# Patient Record
Sex: Male | Born: 1985 | Hispanic: No | Marital: Married | Smoking: Former smoker
Health system: Southern US, Community
[De-identification: ages and names within clinical notes are randomized; demographics above are authoritative.]

---
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
## 2007-09-28 ENCOUNTER — Emergency Department (HOSPITAL_COMMUNITY): Admission: EM | Admit: 2007-09-28 | Discharge: 2007-09-28 | Payer: Self-pay | Admitting: Family Medicine

## 2008-06-19 ENCOUNTER — Emergency Department (HOSPITAL_COMMUNITY): Admission: EM | Admit: 2008-06-19 | Discharge: 2008-06-19 | Payer: Self-pay | Admitting: Family Medicine

## 2008-06-22 ENCOUNTER — Emergency Department (HOSPITAL_COMMUNITY): Admission: EM | Admit: 2008-06-22 | Discharge: 2008-06-22 | Payer: Self-pay | Admitting: Emergency Medicine

## 2011-08-13 ENCOUNTER — Emergency Department (HOSPITAL_COMMUNITY)
Admission: EM | Admit: 2011-08-13 | Discharge: 2011-08-14 | Disposition: A | Discharge status: Home / Self Care | Payer: Worker's Compensation | Attending: Emergency Medicine | Admitting: Emergency Medicine

## 2011-08-13 ENCOUNTER — Emergency Department (HOSPITAL_COMMUNITY): Payer: Worker's Compensation

## 2011-08-13 DIAGNOSIS — S8990XA Unspecified injury of unspecified lower leg, initial encounter: Secondary | ICD-10-CM | POA: Insufficient documentation

## 2011-08-13 DIAGNOSIS — Y990 Civilian activity done for income or pay: Secondary | ICD-10-CM | POA: Insufficient documentation

## 2011-08-13 DIAGNOSIS — S99929A Unspecified injury of unspecified foot, initial encounter: Secondary | ICD-10-CM | POA: Insufficient documentation

## 2011-08-13 DIAGNOSIS — Y9269 Other specified industrial and construction area as the place of occurrence of the external cause: Secondary | ICD-10-CM | POA: Insufficient documentation

## 2011-08-13 DIAGNOSIS — W240XXA Contact with lifting devices, not elsewhere classified, initial encounter: Secondary | ICD-10-CM | POA: Insufficient documentation

## 2011-08-13 DIAGNOSIS — M79609 Pain in unspecified limb: Secondary | ICD-10-CM | POA: Insufficient documentation

## 2012-01-25 DEATH — deceased

## 2013-01-25 ENCOUNTER — Encounter (HOSPITAL_COMMUNITY): Payer: Self-pay | Admitting: *Deleted

## 2013-01-25 ENCOUNTER — Emergency Department (HOSPITAL_COMMUNITY)
Admission: EM | Admit: 2013-01-25 | Discharge: 2013-01-25 | Disposition: A | Discharge status: Home / Self Care | Payer: Self-pay | Attending: Emergency Medicine | Admitting: Emergency Medicine

## 2013-01-25 DIAGNOSIS — F17200 Nicotine dependence, unspecified, uncomplicated: Secondary | ICD-10-CM | POA: Insufficient documentation

## 2013-01-25 DIAGNOSIS — S61209A Unspecified open wound of unspecified finger without damage to nail, initial encounter: Secondary | ICD-10-CM | POA: Insufficient documentation

## 2013-01-25 DIAGNOSIS — Y9229 Other specified public building as the place of occurrence of the external cause: Secondary | ICD-10-CM | POA: Insufficient documentation

## 2013-01-25 DIAGNOSIS — W260XXA Contact with knife, initial encounter: Secondary | ICD-10-CM | POA: Insufficient documentation

## 2013-01-25 DIAGNOSIS — Z23 Encounter for immunization: Secondary | ICD-10-CM | POA: Insufficient documentation

## 2013-01-25 DIAGNOSIS — Y93G1 Activity, food preparation and clean up: Secondary | ICD-10-CM | POA: Insufficient documentation

## 2013-01-25 MED ORDER — TETANUS-DIPHTH-ACELL PERTUSSIS 5-2.5-18.5 LF-MCG/0.5 IM SUSP
0.5000 mL | Freq: Once | INTRAMUSCULAR | Status: AC
  Administered 2013-01-25: 0.5 mL via INTRAMUSCULAR
  Filled 2013-01-25: qty 0.5

## 2013-01-25 NOTE — ED Provider Notes (Signed)
History  This chart was scribed for non-physician practitioner working with Ethelda Chick, MD by Erskine Emery, ED Scribe. This patient was seen in room TR08C/TR08C and the patient's care was started at 18:26.   CSN: [id number]  Arrival date & time 01/25/13  Rickey Primus   First MD Initiated Contact with Patient 01/25/13 1826      Chief Complaint  Patient presents with  . Finger Injury    (Consider location/radiation/quality/duration/timing/severity/associated sxs/prior treatment) The history is provided by the patient. No language interpreter was used.  Jeremy Little is a 27 y.o. male who presents to the Emergency Department complaining of a laceration to his right middle finger since 5:30pm this evening when he cut it on a clean, sharp knife at work, as a Midwife. Pt applied a pressure dressing and the bleeding now appears to be controlled. Pt had FROM and denies any associated numbness or tingling. He reports his pain is about a 4/10 but his anxiety is about a 12/10. He has a phobia of hospitals and needles. Pt does not know if his Tetanus is UTD. Pt drove here and does not have a ride home.  Pt has no PCP.  History reviewed. No pertinent past medical history.  History reviewed. No pertinent past surgical history.  History reviewed. No pertinent family history.  History  Substance Use Topics  . Smoking status: Current Every Day Smoker    Types: Cigarettes  . Smokeless tobacco: Not on file  . Alcohol Use: No      Review of Systems  Constitutional: Negative for fever and diaphoresis.  HENT: Negative for neck pain and neck stiffness.   Eyes: Negative for visual disturbance.  Respiratory: Negative for apnea, chest tightness and shortness of breath.   Cardiovascular: Negative for chest pain and palpitations.  Gastrointestinal: Negative for nausea, vomiting, diarrhea and constipation.  Genitourinary: Negative for dysuria.  Musculoskeletal: Negative for gait problem.  Skin: Positive  for wound (right middle finger laceration). Negative for rash.       Middle finger of right hand  Neurological: Negative for dizziness, weakness, light-headedness, numbness and headaches.  Psychiatric/Behavioral: The patient is nervous/anxious.   All other systems reviewed and are negative.    Allergies  Review of patient's allergies indicates no known allergies.  Home Medications  No current outpatient prescriptions on file.  Triage Vitals: BP 117/61  Pulse 89  Temp(Src) 98.4 F (36.9 C) (Oral)  Resp 18  SpO2 99%  Physical Exam  Nursing note and vitals reviewed. Constitutional: He is oriented to person, place, and time. He appears well-developed and well-nourished. No distress.  HENT:  Head: Normocephalic and atraumatic.  Eyes: Conjunctivae and EOM are normal.  Neck: Normal range of motion. Neck supple.  No meningeal signs  Cardiovascular: Normal rate, regular rhythm, normal heart sounds and intact distal pulses.  Exam reveals no gallop and no friction rub.   No murmur heard. Pulmonary/Chest: Effort normal and breath sounds normal. No respiratory distress. He has no wheezes. He has no rales. He exhibits no tenderness.  Abdominal: Soft. Bowel sounds are normal. He exhibits no distension. There is no tenderness.  Musculoskeletal: Normal range of motion. He exhibits tenderness. He exhibits no edema.  Full range of motion, no joint involvement  Neurological: He is alert and oriented to person, place, and time. No cranial nerve deficit.  No focal deficits, sensation to light touch intact, two-point discrimination intact  Skin: Skin is warm and dry. He is not diaphoretic. No erythema.  4 cm  laceration to right middle finger.    ED Course  Procedures (including critical care time) DIAGNOSTIC STUDIES: Oxygen Saturation is 99% on room air, normal by my interpretation.    COORDINATION OF CARE: 20:45--I checked on the pt.  21:07--I evaluated the patient and we discussed a  treatment plan including suture repair to which the pt agreed. Pt refused pain medications as he is driving home and needs to work.  21:10--I performed laceration repair. LACERATION REPAIR PROCEDURE NOTE The patient's identification was confirmed and consent was obtained. This procedure was performed by Glade Nurse, PA at 9:10 PM. Site: right middle finger Sterile procedures observed: yes Anesthetic used (type and amt): 2% lidocaine without epinephrine Suture type/size: 4-0 prolene Length: 4 cm # of Sutures: 4 Technique: interrupted Complexity: simple Tetanus UTD or ordered: ordered Site anesthetized, irrigated with NS, explored without evidence of foreign body, wound well approximated, site covered with dry, sterile dressing.  Patient tolerated procedure well without complications. Instructions for care discussed verbally and patient provided with additional written instructions for homecare and f/u.  I notified the pt that he needs to have his sutures removed in about 7 days.   Labs Reviewed - No data to display No results found.   Diagnosis: laceration to middle finger on right hand    MDM  Tdap booster given. Wound cleaning complete with pressure irrigation, bottom of wound visualized, no foreign bodies appreciated. Laceration occurred < 8 hours prior to repair which was well tolerated. Pt has no co morbidities to effect normal wound healing. Discussed suture home care w pt and answered questions. Pt to f-u for wound check and suture removal in 7 days. Pt is hemodynamically stable w no complaints prior to dc.    I personally performed the services described in this documentation, which was scribed in my presence. The recorded information has been reviewed and is accurate.   Glade Nurse, PA-C 01/27/13 1714

## 2013-01-25 NOTE — ED Notes (Signed)
Reports cutting right middle finger on sharp knife at work, pt has pressure dressing applied pta and appears bleeding is controlled. Unknown tetanus.

## 2013-01-25 NOTE — ED Notes (Signed)
Alcario Drought, RN completed wound care, sterile guaze applied to wound, per pt tolerated procedure well

## 2013-01-25 NOTE — ED Notes (Signed)
Pt to return with photo ID for completion of worker's compensation paperwork sent my place of employment

## 2013-02-03 NOTE — ED Provider Notes (Signed)
Medical screening examination/treatment/procedure(s) were performed by non-physician practitioner and as supervising physician I was immediately available for consultation/collaboration.  Martha K Linker, MD 02/03/13 1706 

## 2017-03-05 ENCOUNTER — Other Ambulatory Visit: Payer: Self-pay | Admitting: Occupational Medicine

## 2017-03-05 ENCOUNTER — Ambulatory Visit: Payer: Self-pay

## 2017-03-05 DIAGNOSIS — R0781 Pleurodynia: Secondary | ICD-10-CM

## 2020-01-28 ENCOUNTER — Emergency Department (HOSPITAL_COMMUNITY): Payer: BC Managed Care – PPO

## 2020-01-28 ENCOUNTER — Encounter (HOSPITAL_COMMUNITY): Payer: Self-pay

## 2020-01-28 ENCOUNTER — Other Ambulatory Visit: Payer: Self-pay

## 2020-01-28 ENCOUNTER — Emergency Department (HOSPITAL_COMMUNITY)
Admission: EM | Admit: 2020-01-28 | Discharge: 2020-01-28 | Disposition: A | Discharge status: Home / Self Care | Payer: BC Managed Care – PPO | Attending: Emergency Medicine | Admitting: Emergency Medicine

## 2020-01-28 DIAGNOSIS — R42 Dizziness and giddiness: Secondary | ICD-10-CM | POA: Diagnosis not present

## 2020-01-28 DIAGNOSIS — F17210 Nicotine dependence, cigarettes, uncomplicated: Secondary | ICD-10-CM | POA: Diagnosis not present

## 2020-01-28 DIAGNOSIS — R519 Headache, unspecified: Secondary | ICD-10-CM

## 2020-01-28 DIAGNOSIS — H9193 Unspecified hearing loss, bilateral: Secondary | ICD-10-CM | POA: Insufficient documentation

## 2020-01-28 DIAGNOSIS — G43809 Other migraine, not intractable, without status migrainosus: Secondary | ICD-10-CM | POA: Insufficient documentation

## 2020-01-28 MED ORDER — METOCLOPRAMIDE HCL 5 MG/ML IJ SOLN: 10.0000 mg | Freq: Once | INTRAMUSCULAR | Status: DC

## 2020-01-28 MED ORDER — DIPHENHYDRAMINE HCL 25 MG PO CAPS
50.0000 mg | ORAL_CAPSULE | Freq: Once | ORAL | Status: AC
  Administered 2020-01-28: 50 mg via ORAL
  Filled 2020-01-28: qty 2

## 2020-01-28 MED ORDER — SODIUM CHLORIDE 0.9 % IV BOLUS: 1000.0000 mL | Freq: Once | INTRAVENOUS | Status: DC

## 2020-01-28 MED ORDER — PROCHLORPERAZINE MALEATE 5 MG PO TABS
10.0000 mg | ORAL_TABLET | Freq: Once | ORAL | Status: AC
  Administered 2020-01-28: 10 mg via ORAL
  Filled 2020-01-28: qty 2

## 2020-01-28 MED ORDER — DIPHENHYDRAMINE HCL 50 MG/ML IJ SOLN: 25.0000 mg | Freq: Once | INTRAMUSCULAR | Status: DC

## 2020-01-28 NOTE — ED Notes (Signed)
Spoke with mother while on speaker in pt. room

## 2020-01-28 NOTE — ED Triage Notes (Signed)
Pt reports while at work he suddenly had a severe headache in the back of his head and lost most of his hearing. Pt states he works in a loud environment but this has never happened before. Pt took 4 ibuprofen PTA and pain is now 3/10. Denies visual changes or dizziness.

## 2020-01-28 NOTE — ED Provider Notes (Signed)
[facility] EMERGENCY DEPARTMENT Provider Note   CSN: [id number] Arrival date & time: 01/28/20  1006     History Chief Complaint  Patient presents with  . Headache  . Hearing Loss    Jeremy Little is a 34 y.o. male who presents to ED with a chief complaint of headache.  Approximately 4 hours ago was at work standing up when he had a severe headache to the back of his head and bilateral temporal area.  States that he lost about 90% of his hearing when the headache began.  He took 4 ibuprofen prior to arrival and has had gradual improvement in his headache and hearing loss.  Denies history of similar symptoms in the past.  Does have a history of migraines about 10 years ago but this does not feel similar and he did not have any hearing loss associated with his migraines.  Ports associated dizziness she denies any loss of consciousness, neck pain or stiffness, fever, vomiting, numbness in arms or legs, injuries or falls, anticoagulant use, personal history of aneurysms.  HPI     History reviewed. No pertinent past medical history.  There are no problems to display for this patient.   History reviewed. No pertinent surgical history.     No family history on file.  Social History   Tobacco Use  . Smoking status: Current Every Day Smoker    Types: Cigarettes  Substance Use Topics  . Alcohol use: No  . Drug use: No    Home Medications Prior to Admission medications   Medication Sig Start Date End Date Taking? Authorizing Provider  acetaminophen (TYLENOL) 325 MG tablet Take 650 mg by mouth every 6 (six) hours as needed for mild pain.   Yes [provider]  ibuprofen (ADVIL) 200 MG tablet Take 600-800 mg by mouth every 8 (eight) hours as needed for moderate pain.   Yes [provider]    Allergies    Patient has no known allergies.  Review of Systems   Review of Systems  Constitutional: Negative for appetite change, chills and fever.    HENT: Positive for hearing loss. Negative for ear pain, rhinorrhea, sneezing and sore throat.   Eyes: Negative for photophobia and visual disturbance.  Respiratory: Negative for cough, chest tightness, shortness of breath and wheezing.   Cardiovascular: Negative for chest pain and palpitations.  Gastrointestinal: Negative for abdominal pain, blood in stool, constipation, diarrhea, nausea and vomiting.  Genitourinary: Negative for dysuria, hematuria and urgency.  Musculoskeletal: Negative for myalgias.  Skin: Negative for rash.  Neurological: Positive for dizziness and headaches. Negative for weakness and light-headedness.    Physical Exam Updated Vital Signs BP 115/74   Pulse 66   Temp 97.7 F (36.5 C) (Oral)   Resp 18   Ht 5\' 6"  (1.676 m)   Wt 57.2 kg   SpO2 99%   BMI 20.34 kg/m   Physical Exam Vitals and nursing note reviewed.  Constitutional:      General: He is not in acute distress.    Appearance: He is well-developed.  HENT:     Head: Normocephalic and atraumatic.     Right Ear: Tympanic membrane normal. No mastoid tenderness. No hemotympanum.     Left Ear: Tympanic membrane normal. No mastoid tenderness. No hemotympanum.     Nose: Nose normal.  Eyes:     General: No scleral icterus.       Right eye: No discharge.  Left eye: No discharge.     Conjunctiva/sclera: Conjunctivae normal.     Pupils: Pupils are equal, round, and reactive to light.  Neck:     Comments: No meningismus. Cardiovascular:     Rate and Rhythm: Normal rate and regular rhythm.     Heart sounds: Normal heart sounds. No murmur. No friction rub. No gallop.   Pulmonary:     Effort: Pulmonary effort is normal. No respiratory distress.     Breath sounds: Normal breath sounds.  Abdominal:     General: Bowel sounds are normal. There is no distension.     Palpations: Abdomen is soft.     Tenderness: There is no abdominal tenderness. There is no guarding.  Musculoskeletal:        General:  Normal range of motion.     Cervical back: Normal range of motion and neck supple.  Skin:    General: Skin is warm and dry.     Findings: No rash.  Neurological:     General: No focal deficit present.     Mental Status: He is alert and oriented to person, place, and time.     Cranial Nerves: No cranial nerve deficit.     Sensory: No sensory deficit.     Motor: No weakness or abnormal muscle tone.     Coordination: Coordination normal.     Comments: Pupils reactive. No facial asymmetry noted. Cranial nerves appear grossly intact. Sensation intact to light touch on face, BUE and BLE. Strength 5/5 in BUE and BLE.     ED Results / Procedures / Treatments   Labs (all labs ordered are listed, but only abnormal results are displayed) Labs Reviewed  RAPID URINE DRUG SCREEN, HOSP PERFORMED    EKG None  Radiology CT HEAD WO CONTRAST  Result Date: 01/28/2020 CLINICAL DATA:  Headache EXAM: CT HEAD WITHOUT CONTRAST TECHNIQUE: Contiguous axial images were obtained from the base of the skull through the vertex without intravenous contrast. COMPARISON:  None. FINDINGS: Brain: There is no acute intracranial hemorrhage, mass-effect, or edema. Gray-white differentiation is preserved. There is no extra-axial fluid collection. Ventricles and sulci are within normal limits in size and configuration. Vascular: No hyperdense vessel or unexpected calcification. Skull: Calvarium is unremarkable. Sinuses/Orbits: Minimal layering secretions in the left sphenoid sinus. Orbits are unremarkable. Other: None. IMPRESSION: Normal CT of the head. Electronically Signed   By: Guadlupe Spanish M.D.   On: 01/28/2020 11:17    Procedures Procedures (including critical care time)  Medications Ordered in ED Medications  prochlorperazine (COMPAZINE) tablet 10 mg (10 mg Oral Given 01/28/20 1354)  diphenhydrAMINE (BENADRYL) capsule 50 mg (50 mg Oral Given 01/28/20 1354)    ED Course  I have reviewed the triage vital signs and  the nursing notes.  Pertinent labs & imaging results that were available during my care of the patient were reviewed by me and considered in my medical decision making (see chart for details).  Clinical Course as of Jan 28 1527  Thu Jan 28, 2020  1311 Spoke to Dr. Laurence Slate, neurology who recommends CTA head and neck +/- MRI depending on findings.   [HK]    Clinical Course User Index [HK] Dietrich Pates, PA-C   MDM Rules/Calculators/A&P                      34 year old male presents to ED for headache and hearing loss that occurred prior to arrival.  Symptoms began at approximately 830 this morning.  Significantly improved with ibuprofen prior to arrival.  Still reports some changes to his hearing.  Patient with no deficits neurological exam during my evaluation.  No meningeal signs noted.  Vital signs are within normal limits.  Noncontrast CT of the head is negative for acute abnormality.  No evidence of subarachnoid hemorrhage.  Patient was evaluated by Dr. Wilford Corner of neurology who recommends lab work and MRIs.  Patient does not want CTA, any IV or blood work drawn.  Will obtain MRIs of the brain as well as MR angios of the head and neck.  If negative, patient will be discharged home as his symptoms are most likely due to a complex migraine. His symptoms continue to improve here with Tylenol. Care handed off to oncoming provider pending disposition reassessment after imaging.  Final Clinical Impression(s) / ED Diagnoses Final diagnoses:  Bilateral hearing loss, unspecified hearing loss type  Other migraine without status migrainosus, not intractable    Rx / DC Orders ED Discharge Orders    None      Portions of this note were generated with Dragon dictation software. Dictation errors may occur despite best attempts at proofreading.    Dietrich Pates, PA-C 01/28/20 1528    Sabas Sous, MD 02/02/20 [phone]

## 2020-01-28 NOTE — ED Notes (Signed)
Pt reports hearing improving to 60%. Is able to hear staff. Headache has improved with Tylenol.

## 2020-01-28 NOTE — ED Notes (Signed)
Pt taken to MRI  

## 2020-01-28 NOTE — Discharge Instructions (Signed)
Get help right away if: °Your migraine headache becomes severe. °Your migraine headache lasts longer than 72 hours. °You have a fever. °You have a stiff neck. °You have vision loss. °Your muscles feel weak or like you cannot control them. °You start to lose your balance often. °You have trouble walking. °You faint. °You have a seizure. °

## 2020-01-28 NOTE — ED Notes (Addendum)
Pt refused IV as well as bloodwork, states, " hates needles and is feeling very anxious". Dr. Truitt Merle.

## 2020-01-28 NOTE — ED Notes (Signed)
Pt was discharge instructions and questions were clarified. Pt acknowledge teaching and instructions.

## 2020-01-28 NOTE — Consult Note (Addendum)
Neurology Consultation  Reason for Consult: Headache, hearing loss of sudden onset Referring Physician: Dr Pilar Plate, EDP  CC: Sudden onset of hearing loss after sudden onset of headache History is obtained from: Patient  HPI: Jeremy Little is a 34 y.o. male with no significant past medical history presenting to the emergency room for evaluation of sudden onset of headache in the back of the head and ensuing hearing loss which has since improved now to near normal. 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  He could not hear anything that his colleagues were saying.  Due to the sudden onset of these neurological symptoms, he was brought into the emergency room for evaluation.  His symptoms of since improved. He does not know his family history-no history of aneurysms could be elicited. Does not report a thunderclap type of headache but says it was a very severe headache which came on without any warning.  He has had migraines as a young adult but has not had them for many years. Denies any history of strokes in the family again secondary to not knowing his family history.. Denies fevers or chills.  Denies neck pain.  Denies neck stiffness.  Denies any sick exposures.  Denies chest pain.  Denies nausea vomiting.  Denies abdominal pain.  Denies any visual changes but said he felt dizzy at that time as an lightheaded.  LKW: 8 AM today tpa given?: no, symptoms likely concerning for complex migraine and not a stroke.  NIH 0.  Outside the window by the time seen by neurology. Premorbid modified Rankin scale (mRS): 0  ROS: ROS was performed and is negative except as noted in the HPI.  Past medical history: None   No family history on file. Patient does not know his family.  Social  History:   reports that he has been smoking cigarettes. He does not have any smokeless tobacco history on file. He reports that he does not drink alcohol or use drugs.  Confirmed above.  Medications No current facility-administered medications for this encounter. No current outpatient medications on file. No home meds  Exam: Current vital signs: BP 115/74   Pulse 66   Temp 97.7 F (36.5 C) (Oral)   Resp 18   Ht 5\' 6"  (1.676 m)   Wt 57.2 kg   SpO2 99%   BMI 20.34 kg/m  Vital signs in last 24 hours: Temp:  [97.7 F (36.5 C)] 97.7 F (36.5 C) (03/04 1009) Pulse Rate:  [66-130] 66 (03/04 1430) Resp:  [18] 18 (03/04 1009) BP: (98-128)/(74-89) 115/74 (03/04 1430) SpO2:  [98 %-100 %] 99 % (03/04 1430) Weight:  [57.2 kg] 57.2 kg (03/04 1329) GENERAL: Awake, alert in NAD HEENT: - Normocephalic and atraumatic, dry mm, no LN++, no Thyromegally.  No neck stiffness LUNGS - Clear to auscultation bilaterally with no wheezes CV - S1S2 RRR, no m/r/g, equal pulses bilaterally. ABDOMEN - Soft, nontender, nondistended with normoactive BS Ext: warm, well perfused, intact peripheral pulses, no edema Kernig's negative.  Brudzinski is negative.  NEURO:  Mental Status: AA&Ox3  Language: speech is nondysarthric.  Naming, repetition, fluency, and comprehension intact. Cranial Nerves: PERRL. EOMI, visual fields full, no facial asymmetry, facial sensation intact, hearing mildly reduced bilaterally-worse loss on the right compared  to left.  Upon using the tuning fork, he could hear some buzzing of the 128 Hz fork faintly in the left ear but barely in the right ear.  Upon putting the tuning fork on the mastoids, again same result as above.  Upon putting the tuning fork on the forehead, he reported hearing less and feeling less buzzing on the right compared to the left. Tongue/uvula/soft palate midline, normal sternocleidomastoid and trapezius muscle strength. No evidence of tongue atrophy or  fibrillations Motor: 5/5 with no drift in all 4 extremities Tone: is normal and bulk is normal Sensation- Intact to light touch bilaterally Coordination: FTN intact bilaterally Gait- deferred  NIHSS= 0   Labs I have reviewed labs in epic and the results pertinent to this consultation are:   CBC No results found for: WBC, RBC, HGB, HCT, PLT, MCV, MCH, MCHC, RDW, LYMPHSABS, MONOABS, EOSABS, BASOSABS  CMP  No results found for: NA, K, CL, CO2, GLUCOSE, BUN, CREATININE, CALCIUM, PROT, ALBUMIN, AST, ALT, ALKPHOS, BILITOT, GFRNONAA, GFRAA  Lipid Panel  No results found for: CHOL, TRIG, HDL, CHOLHDL, VLDL, LDLCALC, LDLDIRECT   Imaging I have reviewed the images obtained: CT-scan of the brain-no acute changes  Assessment:  34 year old with sudden onset of headache and bilateral hearing loss which has since improved-both the headache and the hearing loss. Reports worse hearing loss on the right than the left. Examination as documented above-with some inconsistencies and possible psychological overlay. Reports excessive work stressors in the recent few weeks. Had migraines many years ago but has not had a migraine headache for a while. Differentials include complex migraine versus nonorganic etiology but given the sudden onset of a headache-although not the worst headache of his life, with auditory symptoms, differential should include subarachnoid hemorrhage and meningitis. Clinical exam is not consistent with either. MR brain and head and neck vessel imaging should be performed to rule out aneurysm.  Impression: Headache and hearing loss/likely complex migraine versus nonorganic etiology  Recommendations: -MRI brain -MRA head without contrast and MRA neck without contrast (would have preferred a CTA but patient is very afraid of needles and would not want IV access.) -I would recommend getting a urinary toxicology screen as well as basic set of labs including CBC and BMP. -Can  try migraine cocktail after MRI MRA  Exam is reassuring-does not appear to be consistent with subarachnoid bleed or meningitis.  Other causes of bilateral deafness could include an AICA stroke, but that is mostly unilateral-and his symptoms have nearly resolved.  If imaging is unremarkable and labs are unremarkable, no further neurological recommendations.  Plan relayed to Dr. Sedonia Small in the ED. -- Amie Portland, MD [facility] Pager: [phone] If 7pm to 7am, please call on call as listed on AMION.  Addendum MRI brain MRA head and neck reviewed-no acute changes.  Mucous retention cyst in the sphenoid sinus on the left and maxillary sinus on the right.  Formal read from neuroradiology pending. Do not anticipate that the formal read would have anything acute that can explain his symptoms. Impression status as above. He can be discharged if the headache is better  -- Amie Portland, MD [facility] Pager: [phone] If 7pm to 7am, please call on call as listed on AMION.

## 2020-01-29 ENCOUNTER — Ambulatory Visit (INDEPENDENT_AMBULATORY_CARE_PROVIDER_SITE_OTHER): Payer: BC Managed Care – PPO | Admitting: Otolaryngology

## 2020-01-29 ENCOUNTER — Encounter (INDEPENDENT_AMBULATORY_CARE_PROVIDER_SITE_OTHER): Payer: Self-pay | Admitting: Otolaryngology

## 2020-01-29 VITALS — Temp 97.5°F

## 2020-01-29 DIAGNOSIS — G4459 Other complicated headache syndrome: Secondary | ICD-10-CM | POA: Diagnosis not present

## 2020-01-29 NOTE — Progress Notes (Signed)
HPI: Jeremy Little is a 34 y.o. male who presents is referred by [facility] urgent care for evaluation of recent complaints of hearing loss.  Apparently patient developed bad headaches yesterday and noticed hearing loss in both ears.  He states that it took 4 - 5 hours for the hearing to get better.  He apparently had an urgent CT scan and MRI scan that was read as normal with no cochlear or retrocochlear pathology noted.  He was apparently diagnosed with a migraine headache and referred here for hearing evaluation.  Is also referred to neurology and is in the process of making an appointment with them. He feels like his hearing is much better today but still not 100%.  No past medical history on file. No past surgical history on file. Social History   Socioeconomic History  . Marital status: Single    Spouse name: Not on file  . Number of children: Not on file  . Years of education: Not on file  . Highest education level: Not on file  Occupational History  . Not on file  Tobacco Use  . Smoking status: Current Every Day Smoker    Packs/day: 1.00    Years: 16.00    Pack years: 16.00    Types: Cigarettes    Start date: 2004  . Smokeless tobacco: Never Used  Substance and Sexual Activity  . Alcohol use: No  . Drug use: No  . Sexual activity: Not on file  Other Topics Concern  . Not on file  Social History Narrative  . Not on file   Social Determinants of Health   Financial Resource Strain:   . Difficulty of Paying Living Expenses: Not on file  Food Insecurity:   . Worried About Charity fundraiser in the Last Year: Not on file  . Ran Out of Food in the Last Year: Not on file  Transportation Needs:   . Lack of Transportation (Medical): Not on file  . Lack of Transportation (Non-Medical): Not on file  Physical Activity:   . Days of Exercise per Week: Not on file  . Minutes of Exercise per Session: Not on file  Stress:   . Feeling of Stress : Not on file  Social  Connections:   . Frequency of Communication with Friends and Family: Not on file  . Frequency of Social Gatherings with Friends and Family: Not on file  . Attends Religious Services: Not on file  . Active Member of Clubs or Organizations: Not on file  . Attends Archivist Meetings: Not on file  . Marital Status: Not on file   No family history on file. No Known Allergies Prior to Admission medications   Medication Sig Start Date End Date Taking? Authorizing Provider  acetaminophen (TYLENOL) 325 MG tablet Take 650 mg by mouth every 6 (six) hours as needed for mild pain.   Yes [provider]  ibuprofen (ADVIL) 200 MG tablet Take 600-800 mg by mouth every 8 (eight) hours as needed for moderate pain.   Yes [provider]     Positive ROS: Otherwise negative  All other systems have been reviewed and were otherwise negative with the exception of those mentioned in the HPI and as above.  Physical Exam: Constitutional: Alert, well-appearing, no acute distress.  He is able to hear conversation without difficulty. Ears: External ears without lesions or tenderness. Ear canals are clear bilaterally with intact, clear TMs bilaterally. Nasal: External nose without lesions. Septum midline.Marland Kitchen  Clear nasal passages Oral: Lips and gums without lesions. Tongue and palate mucosa without lesions. Posterior oropharynx clear. Neck: No palpable adenopathy or masses Respiratory: Breathing comfortably  Skin: No facial/neck lesions or rash noted.  Audiogram was performed in the office today and demonstrated normal hearing in both ears with SRT's of 15 dB bilaterally. Review of the CT scan shows no sinus abnormalities, mastoid or middle ear abnormalities.  Procedures  Assessment: Migraine headache Normal hearing evaluation on audiologic testing today  Plan: Did not recommend any specific therapy concerning his hearing. Recommended follow-up with neurology concerning further  treatment of his headaches. Reassured him of normal ear evaluation today.   Narda Bonds, MD   CC:

## 2020-02-01 ENCOUNTER — Encounter (INDEPENDENT_AMBULATORY_CARE_PROVIDER_SITE_OTHER): Payer: Self-pay

## 2020-02-02 ENCOUNTER — Ambulatory Visit: Payer: BC Managed Care – PPO | Admitting: Neurology

## 2020-02-02 ENCOUNTER — Encounter: Payer: Self-pay | Admitting: Neurology

## 2020-02-02 ENCOUNTER — Other Ambulatory Visit: Payer: Self-pay

## 2020-02-02 VITALS — BP 113/67 | HR 83 | Temp 98.0°F | Ht 67.0 in | Wt 125.0 lb

## 2020-02-02 DIAGNOSIS — Z789 Other specified health status: Secondary | ICD-10-CM

## 2020-02-02 DIAGNOSIS — G43009 Migraine without aura, not intractable, without status migrainosus: Secondary | ICD-10-CM

## 2020-02-02 NOTE — Patient Instructions (Addendum)
Your neurological exam is normal. You also had a benign brain MRI last week. I would recommend for your migraine managementTylenol as needed or ibuprofen as needed, none of these are recommended daily. Also, use Excedrin Migraine only if needed. Please try to reduce your caffeine intake and limit yourself to 1-2 servings per day, 8 ounce each. Please work on smoking cessation. Try to stay well-hydrated, 6 to 8 cups of water are recommended or 3-4 water bottles per day, 16.9 ounce each. Please try to stay well rested, try to achieve 7 to 8 h of sleep. I think, for now, we will have you work on lifestyle modification for your migraine management as you do not have a history of recurrent or frequent migraines thankfully. Please follow-up routinely to see one of our nurse practitioners in 3 months.   Try not to skip any meals as blood sugar fluctuations can also trigger headaches. I recommend that you get a formal eye examination done as it has been about 2 years since her last eye exam.  Please remember, common headache triggers are: sleep deprivation, dehydration, overheating, stress, hypoglycemia or skipping meals and blood sugar fluctuations, excessive pain medications or excessive alcohol use or caffeine withdrawal. Some people have food triggers such as aged cheese, orange juice or chocolate, especially dark chocolate, or MSG (monosodium glutamate). Try to avoid these headache triggers as much possible. It may be helpful to keep a headache diary to figure out what makes your headaches worse or brings them on and what alleviates them. Some people report headache onset after exercise but studies have shown that regular exercise may actually prevent headaches from coming. If you have exercise-induced headaches, please make sure that you drink plenty of fluid before and after exercising and that you do not over do it and do not overheat.

## 2020-02-02 NOTE — Progress Notes (Signed)
Subjective:    Patient ID: Jeremy Little is a 34 y.o. male.  HPI     Jeremy Foley, MD, PhD Jeremy Little [address] [address] [place], [place] [address]  I saw patient, Jeremy Little, as a referral from the ER for headache.  The patient is accompanied by his mother today.  Jeremy Little is a 76 year old right-handed gentleman with an underlying medical history of migraine headaches as a teenager and young adult, who reports a history of migraines when he was in high school. Last week he had a severe headache for the first time in a long time. He does endorse stress, his mother is concerned that he does not eat on a regular basis and may not hydrate well enough. He admits that he has done a little better with his water intake, averages about 2-3 bottles of water per day. He does drink quite a bit of caffeine and has also taken Excedrin for the past 2 days, 2 pills twice daily. He did not take any Excedrin today. He believes that the caffeine helps his headaches. He drinks 1 to 2 cups of coffee in the morning and 2-3 large cups of soda, 16 ounce size every day. He has had prescription eyeglasses since age 65. He has not been wearing his eyeglasses according to the mother. He has not had an eye examination in about 2 years he believes. He overall feels better since he presented to the emergency room. His hearing is better, headache is also improved, he denies any sudden onset of one-sided weakness or numbness or tingling or droopy face or slurring of speech. He tries to achieve about 7 to 8 h of sleep, denies any snoring or choking sensations at night. He lives with his parents, he does have a TV on in his bedroom at night. He works in [employer]. He denies any significant nausea or vomiting, he had felt dizzy when the headache was severe last week but denies any lightheadedness currently. He smokes 1 pack/day and is not currently considering smoking cessation. He drinks  alcohol in moderation, 2-3 beers about 3 days out of the week.  He presented to the emergency room on 01/28/2020 with sudden onset of severe headache in the back of his head and bilateral temple areas associated with sudden onset of hearing loss.  I reviewed the emergency room records, he also had a neurological consult in the ER which I reviewed.  He had several imaging tests in the ER.  He had a head CT without contrast on 01/28/2020 and I reviewed the results: IMPRESSION: Normal CT of the head.   He had a brain MRI without contrast as well as MRA head without contrast and MRA neck without contrast on 01/28/2020 and I reviewed the results: IMPRESSION: 1. Unremarkable appearance of the brain. 2. Negative head MRA. 3. Negative neck MRA.  He saw ENT as an outpatient, Dr. Ezzard Standing on 01/29/2020 and I reviewed the note: He was found to have no hearing loss on audiology testing and exam was benign.    His Past Medical History Is Significant For: History reviewed. No pertinent past medical history.  His Past Surgical History Is Significant For: History reviewed. No pertinent surgical history.  His Family History Is Significant For: History reviewed. No pertinent family history.  His Social History Is Significant For: Social History   Socioeconomic History  . Marital status: Single    Spouse name: Not on file  . Number of  children: Not on file  . Years of education: Not on file  . Highest education level: Not on file  Occupational History  . Not on file  Tobacco Use  . Smoking status: Current Every Day Smoker    Packs/day: 1.00    Years: 16.00    Pack years: 16.00    Types: Cigarettes    Start date: 2004  . Smokeless tobacco: Never Used  Substance and Sexual Activity  . Alcohol use: No  . Drug use: No  . Sexual activity: Not on file  Other Topics Concern  . Not on file  Social History Narrative  . Not on file   Social Determinants of Health   Financial Resource Strain:   .  Difficulty of Paying Living Expenses: Not on file  Food Insecurity:   . Worried About Charity fundraiser in the Last Year: Not on file  . Ran Out of Food in the Last Year: Not on file  Transportation Needs:   . Lack of Transportation (Medical): Not on file  . Lack of Transportation (Non-Medical): Not on file  Physical Activity:   . Days of Exercise per Week: Not on file  . Minutes of Exercise per Session: Not on file  Stress:   . Feeling of Stress : Not on file  Social Connections:   . Frequency of Communication with Friends and Family: Not on file  . Frequency of Social Gatherings with Friends and Family: Not on file  . Attends Religious Services: Not on file  . Active Member of Clubs or Organizations: Not on file  . Attends Archivist Meetings: Not on file  . Marital Status: Not on file    His Allergies Are:  No Known Allergies:   His Current Medications Are:  Outpatient Encounter Medications as of 02/02/2020  Medication Sig  . acetaminophen (TYLENOL) 325 MG tablet Take 650 mg by mouth every 6 (six) hours as needed for mild pain.  . Aspirin-Acetaminophen-Caffeine (EXCEDRIN PO) Take by mouth. 2 in the am 2 in the pm  . ibuprofen (ADVIL) 200 MG tablet Take 600-800 mg by mouth every 8 (eight) hours as needed for moderate pain.   No facility-administered encounter medications on file as of 02/02/2020.  :   Review of Systems:  Out of a complete 14 point review of systems, all are reviewed and negative with the exception of these symptoms as listed below:  Review of Systems  Neurological:       Reports last Thursday pt had a migraine that resulted in hearing loss for 6 -7 hours.  Pt reports a dull pain ever since the migraine.     Objective:  Neurological Exam  Physical Exam Physical Examination:   Vitals:   02/02/20 1259  BP: 113/67  Pulse: 83  Temp: 98 F (36.7 C)   General Examination: The patient is a very pleasant 34 y.o. male in no acute distress. He  appears well-developed and well-nourished and well groomed.   HEENT: Normocephalic, atraumatic, pupils are equal, round and reactive to light and accommodation. Funduscopic exam is normal with sharp disc margins noted. Extraocular tracking is good without limitation to gaze excursion or nystagmus noted. Normal smooth pursuit is noted. Hearing is grossly intact. Face is symmetric with normal facial animation and normal facial sensation. Speech is clear with no dysarthria noted. There is no hypophonia. There is no lip, neck/head, jaw or voice tremor. Neck is supple with full range of passive and active motion. There  are no carotid bruits on auscultation. Oropharynx exam reveals: moderate mouth dryness, adequate dental hygiene airway. Tongue protrudes centrally and palate elevates symmetrically.   Chest: Clear to auscultation without wheezing, rhonchi or crackles noted.  Heart: S1+S2+0, regular and normal without murmurs, rubs or gallops noted.   Abdomen: Soft, non-tender and non-distended with normal bowel sounds appreciated on auscultation.  Extremities: There is no pitting edema in the distal lower extremities bilaterally. Pedal pulses are intact.  Skin: Warm and dry without trophic changes noted.  Musculoskeletal: exam reveals no obvious joint deformities, tenderness or joint swelling or erythema.   Neurologically:  Mental status: The patient is awake, alert and oriented in all 4 spheres. His immediate and remote memory, attention, language skills and fund of knowledge are appropriate. There is no evidence of aphasia, agnosia, apraxia or anomia. Speech is clear with normal prosody and enunciation. Thought process is linear. Mood is normal and affect is normal.  Cranial nerves II - XII are as described above under HEENT exam. In addition: shoulder shrug is normal with equal shoulder height noted. Motor exam: Normal bulk, strength and tone is noted. There is no drift, tremor or rebound. Romberg is  negative. Reflexes are 2+ throughout. Babinski: Toes are flexor bilaterally. Fine motor skills and coordination: intact with normal finger taps, normal hand movements, normal rapid alternating patting, normal foot taps and normal foot agility.  Cerebellar testing: No dysmetria or intention tremor on finger to nose testing. Heel to shin is unremarkable bilaterally. There is no truncal or gait ataxia.  Sensory exam: intact to light touch, pinprick, vibration, temperature sense in the upper and lower extremities.  Gait, station and balance: He stands easily. No veering to one side is noted. No leaning to one side is noted. Posture is age-appropriate and stance is narrow based. Gait shows normal stride length and normal pace. No problems turning are noted. Tandem walk is unremarkable. Intact toe and heel stance is noted.               Assessment and plan:  In summary, Jeremy Little is a very pleasant 34 y.o.-year old male with an underlying medical history of migraine headaches as a teenager and young adult, who Presents as a referral from the emergency room for headache. He has a history of migraines since high school. He has not had any frequent or recurrent migrainous headaches for quite some time, presented with a severe headache to the emergency room last week. He had evaluation in the ER including imaging testing which were benign. Neurological exam is normal today. Nevertheless, I suggested a few things today for migraine management: He is advised to reduce his daily caffeine intake and limit himself to 1 to up to 2 servings per day, 8 ounce each. He is advised to try to keep a set schedule for his sleep and try to obtain 7 to 8 h of sleep. He is furthermore advised to stay better hydrated with water, 6 to 8 cups of water per day, 8 ounce each.Furthermore, I recommended that he Pursue a formal eye examination as it has been about 2 years since his last eye exam and mom reports that he had thick glasses  since age 75, he is currently not wearing his eyeglasses on a regular basis. He is advised to eat on a regular basis and try not to skip any meals. Mom is worried that he does not eat on a regular basis.He is cautioned regarding the repeated use of Excedrin.  He is advised that due to the caffeine content it can cause rebound headaches and he also drinks quite a bit of caffeine on a daily basis already. He is advised to use Tylenol or Advil on an as-needed basis and use Excedrin Migraine only if needed with caution. His headache frequency is not high thankfully. His lifestyle modifications may very well result in improvement of his headaches. We talked about migraine and headache triggers quite a bit today. He is also advised to work on smoking cessation. I suggested a follow-up with one of our nurse practitioners in 3 months, sooner if needed. I answered all the questions today and the patient and his mother were in agreement.  Jeremy Foley, MD, PhD

## 2020-05-03 ENCOUNTER — Ambulatory Visit: Payer: BC Managed Care – PPO | Admitting: Adult Health

## 2021-01-06 IMAGING — MR MR HEAD W/O CM
13 of 15 series · 38 of 48 positions shown · non-contrast
Comparison: Head CT 01/28/2020

CLINICAL DATA: Severe headache with associated hearing loss.



[Series 5: DWI · axial · 3.0mm · 0.88mm/px · z∈[-110,+18]mm · 4 of 92 slices shown (1 of 4)]
[im 1/92]
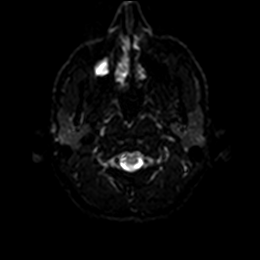
[im 31/92]
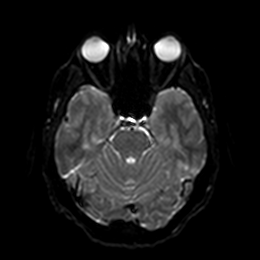
[im 61/92]
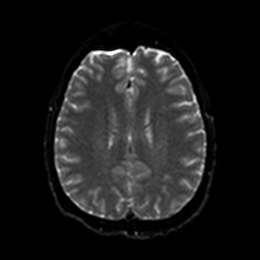
[im 92/92]
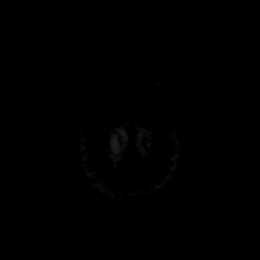

[Series 6: DWI · axial · 3.0mm · 0.88mm/px · z∈[-110,+18]mm · 2 of 46 slices shown (2 of 4)]
[im 1/46]
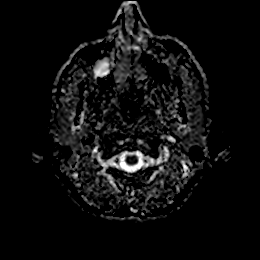
[im 46/46]
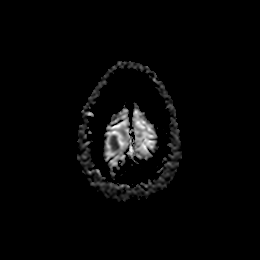

[Series 11: DWI · coronal · 4.0mm · 0.88mm/px · 3 of 68 slices shown (3 of 4)]
[im 1/68]
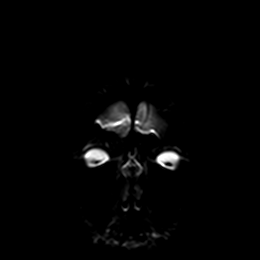
[im 34/68]
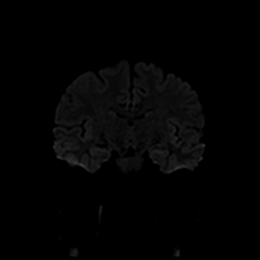
[im 68/68]
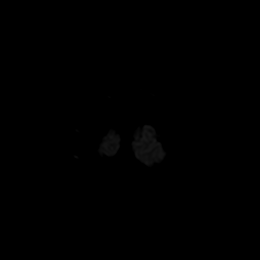

[Series 12: DWI · coronal · 4.0mm · 0.88mm/px · 1 of 34 slices shown (4 of 4)]
[im 1/34]
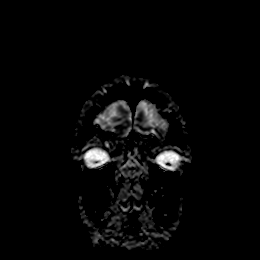

[Series 13: T1 · sagittal · 5.0mm · 0.75mm/px · 1 of 23 slices shown]
[im 1/23]
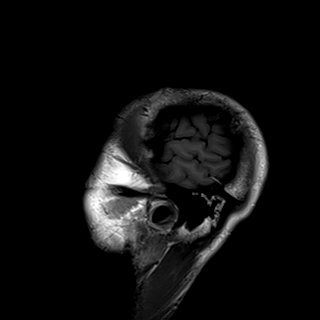

[Series 14: T2 · axial · 5.0mm · 0.72mm/px · 1 of 25 slices shown (1 of 2)]
[im 1/25]
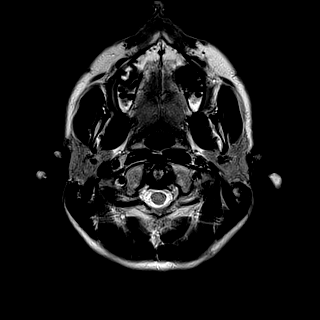

[Series 15: FLAIR · axial · 5.0mm · 0.45mm/px · 1 of 25 slices shown]
[im 1/25]
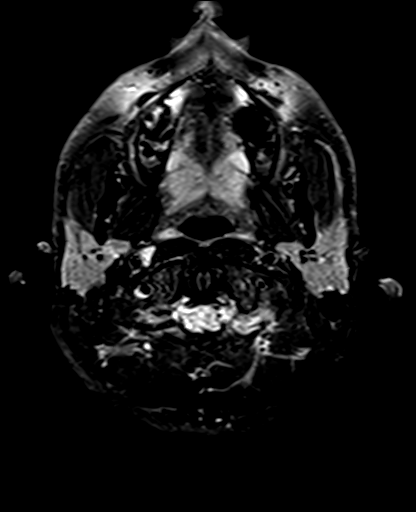

[Series 16: mag_images · axial · 3.0mm · 0.90mm/px · z∈[-121,+46]mm · 3 of 60 slices shown]
[im 1/60]
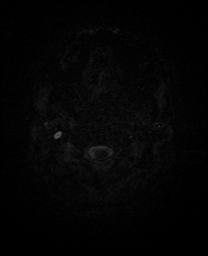
[im 30/60]
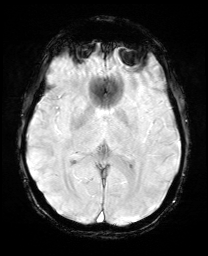
[im 60/60]
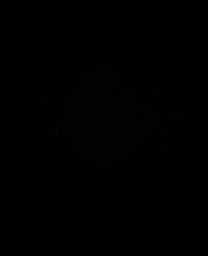

[Series 17: pha_images · axial · 3.0mm · 0.90mm/px · z∈[-121,+29]mm · 2 of 54 slices shown]
[im 1/54]
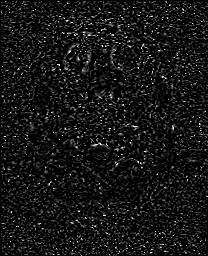
[im 54/54]
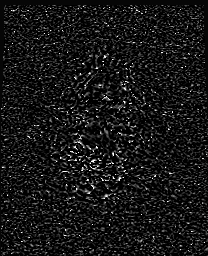

[Series 18: swi_images · axial · 3.0mm · 0.90mm/px · z∈[-121,+46]mm · 3 of 60 slices shown]
[im 1/60]
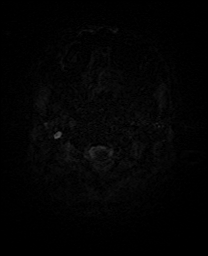
[im 30/60]
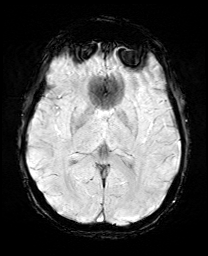
[im 60/60]
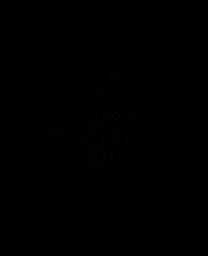

[Series 19: mip_images(sw) · axial · 24.0mm · 0.90mm/px · z∈[-111,+36]mm · 2 of 53 slices shown]
[im 1/53]
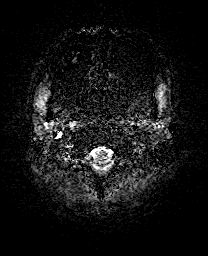
[im 53/53]
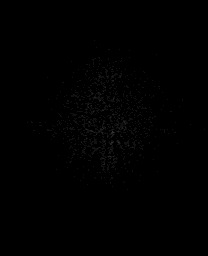

[Series 21: T2 · coronal · 5.0mm · 0.34mm/px · 1 of 29 slices shown (2 of 2)]
[im 1/29]
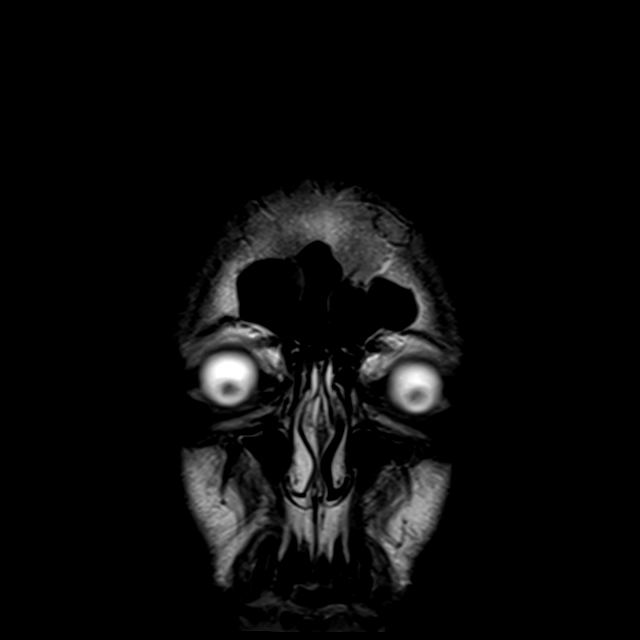

[Series 23: tof_fl3d_tra_iso · axial · 0.6mm · 0.52mm/px · z∈[-267,-96]mm · 14 of 315 slices shown]
[im 1/315]
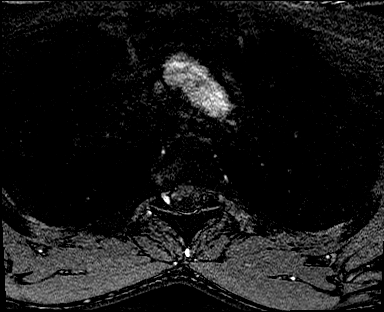
[im 25/315]
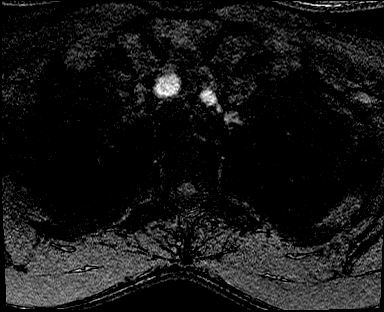
[im 49/315]
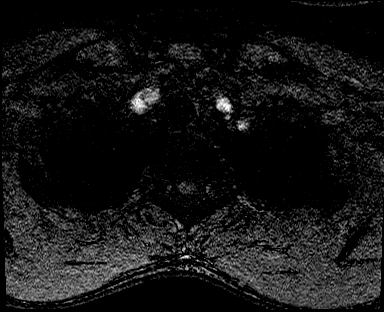
[im 73/315]
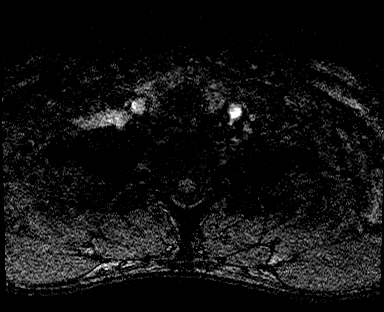
[im 97/315]
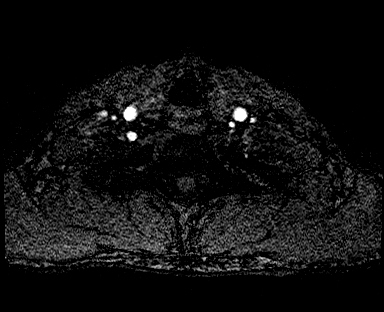
[im 121/315]
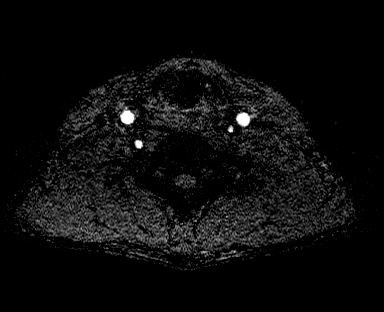
[im 145/315]
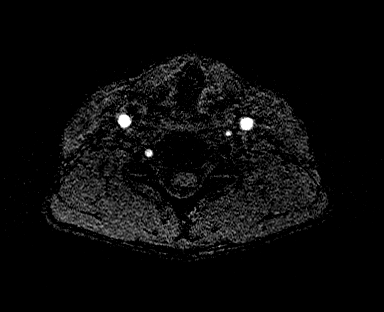
[im 170/315]
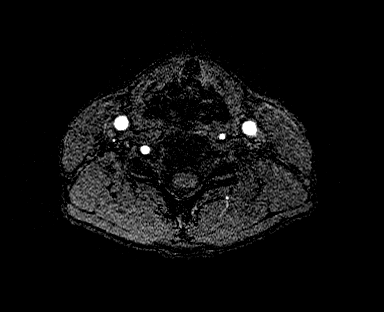
[im 194/315]
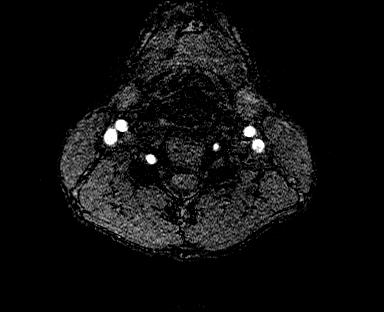
[im 218/315]
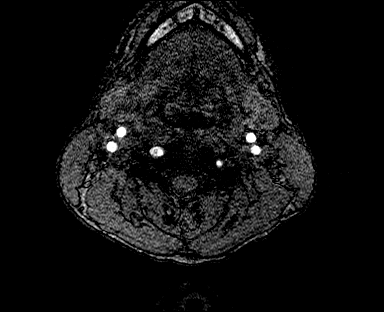
[im 242/315]
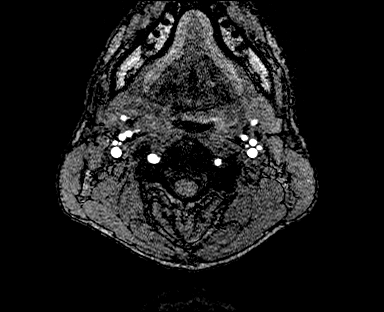
[im 266/315]
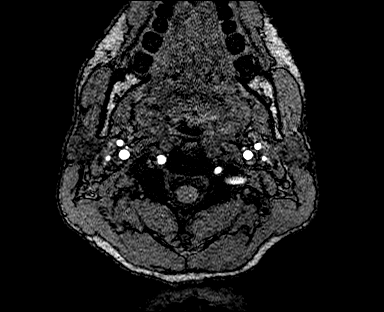
[im 290/315]
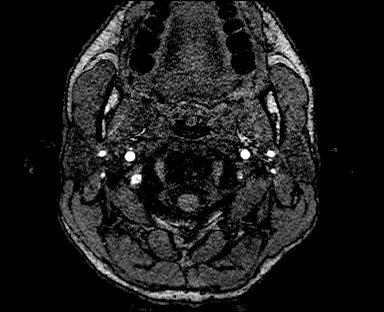
[im 315/315]
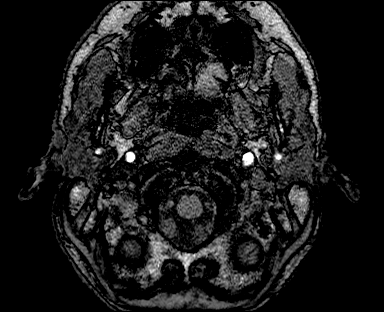

[38 of 48 positions shown; findings below may reference images not displayed]

FINDINGS: MRI HEAD FINDINGS

Brain: There is no evidence of acute infarct, intracranial
hemorrhage, mass, midline shift, or extra-axial fluid collection.
The ventricles and sulci are normal. The brain is normal in signal.

Vascular: Major intracranial vascular flow voids are preserved.

Skull and upper cervical spine: Unremarkable bone marrow signal.

Sinuses/Orbits: Unremarkable orbits. Small mucous retention cysts in
the left sphenoid and right maxillary sinuses. Trace right mastoid
effusion.

Other: None.

MRA HEAD FINDINGS

The intracranial vertebral arteries are patent to the basilar. The
right PICA and left AICA appear dominant. Patent SCAs are seen
bilaterally. The basilar artery is widely patent. There are medium
sized posterior communicating arteries bilaterally. Both PCAs are
patent without evidence of significant stenosis.

The internal carotid arteries are widely patent from skull base to
carotid termini. ACAs and MCAs are patent without evidence of
proximal branch occlusion or significant stenosis. No aneurysm is
identified.

MRA NECK FINDINGS

The aortic arch and arch vessel origins were incompletely imaged,
however the left vertebral artery appears to arise directly from the
arch. Assessment of the proximal left V1 segment is limited by
artifact, however the vertebral arteries are otherwise widely patent
with antegrade flow and without evidence of stenosis or dissection.
The right vertebral artery is mildly dominant. The common carotid
and cervical internal carotid arteries are patent and smooth without
evidence of stenosis or dissection.
IMPRESSION: 1. Unremarkable appearance of the brain.
2. Negative head MRA.
3. Negative neck MRA.

## 2024-05-05 ENCOUNTER — Emergency Department (HOSPITAL_COMMUNITY)

## 2024-05-05 ENCOUNTER — Emergency Department (HOSPITAL_COMMUNITY): Admission: EM | Admit: 2024-05-05 | Discharge: 2024-05-05 | Disposition: A | Discharge status: Home / Self Care

## 2024-05-05 ENCOUNTER — Other Ambulatory Visit: Payer: Self-pay

## 2024-05-05 DIAGNOSIS — R519 Headache, unspecified: Secondary | ICD-10-CM | POA: Insufficient documentation

## 2024-05-05 DIAGNOSIS — R202 Paresthesia of skin: Secondary | ICD-10-CM | POA: Insufficient documentation

## 2024-05-05 DIAGNOSIS — E876 Hypokalemia: Secondary | ICD-10-CM | POA: Insufficient documentation

## 2024-05-05 DIAGNOSIS — G43109 Migraine with aura, not intractable, without status migrainosus: Secondary | ICD-10-CM

## 2024-05-05 LAB — I-STAT CHEM 8, ED
BUN: 11 mg/dL (ref 6–20)
Calcium, Ion: 1.17 mmol/L (ref 1.15–1.40)
Chloride: 99 mmol/L (ref 98–111)
Creatinine, Ser: 0.9 mg/dL (ref 0.61–1.24)
Glucose, Bld: 90 mg/dL (ref 70–99)
HCT: 46 % (ref 39.0–52.0)
Hemoglobin: 15.6 g/dL (ref 13.0–17.0)
Potassium: 3.3 mmol/L — ABNORMAL LOW (ref 3.5–5.1)
Sodium: 138 mmol/L (ref 135–145)
TCO2: 26 mmol/L (ref 22–32)

## 2024-05-05 LAB — COMPREHENSIVE METABOLIC PANEL WITH GFR
ALT: 19 U/L (ref 0–44)
AST: 28 U/L (ref 15–41)
Albumin: 4.2 g/dL (ref 3.5–5.0)
Alkaline Phosphatase: 63 U/L (ref 38–126)
Anion gap: 10 (ref 5–15)
BUN: 10 mg/dL (ref 6–20)
CO2: 26 mmol/L (ref 22–32)
Calcium: 9.3 mg/dL (ref 8.9–10.3)
Chloride: 102 mmol/L (ref 98–111)
Creatinine, Ser: 0.92 mg/dL (ref 0.61–1.24)
GFR, Estimated: >60 mL/min (ref >60)
Glucose, Bld: 93 mg/dL (ref 70–99)
Potassium: 3.3 mmol/L — ABNORMAL LOW (ref 3.5–5.1)
Sodium: 138 mmol/L (ref 135–145)
Total Bilirubin: 1 mg/dL (ref 0.0–1.2)
Total Protein: 7.1 g/dL (ref 6.5–8.1)

## 2024-05-05 LAB — CBG MONITORING, ED: Glucose-Capillary: 82 mg/dL (ref 70–99)

## 2024-05-05 LAB — APTT: aPTT: 30 s (ref 24–36)

## 2024-05-05 LAB — DIFFERENTIAL
Abs Immature Granulocytes: 0.02 10*3/uL (ref 0.00–0.07)
Basophils Absolute: 0 10*3/uL (ref 0.0–0.1)
Basophils Relative: 0 %
Eosinophils Absolute: 0.1 10*3/uL (ref 0.0–0.5)
Eosinophils Relative: 2 %
Immature Granulocytes: 0 %
Lymphocytes Relative: 39 %
Lymphs Abs: 2.7 10*3/uL (ref 0.7–4.0)
Monocytes Absolute: 0.5 10*3/uL (ref 0.1–1.0)
Monocytes Relative: 8 %
Neutro Abs: 3.6 10*3/uL (ref 1.7–7.7)
Neutrophils Relative %: 51 %

## 2024-05-05 LAB — TROPONIN I (HIGH SENSITIVITY)
Troponin I (High Sensitivity): 3 ng/L (ref <18)
Troponin I (High Sensitivity): <2 ng/L (ref <18)

## 2024-05-05 LAB — CBC
HCT: 42.9 % (ref 39.0–52.0)
Hemoglobin: 14.9 g/dL (ref 13.0–17.0)
MCH: 30.8 pg (ref 26.0–34.0)
MCHC: 34.7 g/dL (ref 30.0–36.0)
MCV: 88.8 fL (ref 80.0–100.0)
Platelets: 176 10*3/uL (ref 150–400)
RBC: 4.83 MIL/uL (ref 4.22–5.81)
RDW: 11.7 % (ref 11.5–15.5)
WBC: 7 10*3/uL (ref 4.0–10.5)
nRBC: 0 % (ref 0.0–0.2)

## 2024-05-05 LAB — ETHANOL: Alcohol, Ethyl (B): <15 mg/dL (ref <15)

## 2024-05-05 LAB — PROTIME-INR
INR: 1.1 (ref 0.8–1.2)
Prothrombin Time: 14 s (ref 11.4–15.2)

## 2024-05-05 MED ORDER — SODIUM CHLORIDE 0.9% FLUSH: 3.0000 mL | Freq: Once | INTRAVENOUS | Status: DC

## 2024-05-05 MED ORDER — PROCHLORPERAZINE EDISYLATE 10 MG/2ML IJ SOLN
10.0000 mg | Freq: Once | INTRAMUSCULAR | Status: AC
  Administered 2024-05-05: 10 mg via INTRAVENOUS
  Filled 2024-05-05: qty 2

## 2024-05-05 MED ORDER — DIPHENHYDRAMINE HCL 50 MG/ML IJ SOLN
25.0000 mg | Freq: Once | INTRAMUSCULAR | Status: AC
  Administered 2024-05-05: 25 mg via INTRAVENOUS
  Filled 2024-05-05: qty 1

## 2024-05-05 MED ORDER — LACTATED RINGERS IV BOLUS
1000.0000 mL | Freq: Once | INTRAVENOUS | Status: AC
  Administered 2024-05-05: 1000 mL via INTRAVENOUS

## 2024-05-05 NOTE — Consult Note (Signed)
 NEUROLOGY CONSULT NOTE   Date of service: May 05, 2024 Patient Name: Jeremy Little MRN:  [id number] DOB:  January 12, 1986 Chief Complaint: Headache with left sided paresthesias and weakness Requesting Provider: Carin Charleston, MD  History of Present Illness  Jeremy Little is a 38 y.o. male with a PMHx of migraine headaches who presents from work to the ED via EMS after acute onset of a 4/10 non-throbbing occipital headache together with symptoms of dizziness, transient bilateral deafness, clamminess and left sided paresthesias with weakness. The patient states that the above symptoms were preceded by sharp pain over his left lateral chest wall and flank. LKN [id number]. Time of symptom onset was 0900. EMS noted left hand and leg weakness on scene. BP was 124/78 with CBG of 120. Upon arrival to the ED, the patient stated that his headache had improved to 2/10, but that he continued to have a right sided pins and needles sensation. He endorses seeing floaters on his left side at the onset of symptoms. He has had migraine headaches in the past that were associated with transient hearing loss, but this is the first time he has had any tingling.    ROS  No fevers or chills. Other symptoms as per HPI. Comprehensive ROS deferred due to acuity of presentation.   Past History  No past medical history on file.  No past surgical history on file.  Family History: No family history on file.  Social History  reports that he has been smoking cigarettes. He started smoking about 21 years ago. He has a 21.4 pack-year smoking history. He has never used smokeless tobacco. He reports that he does not drink alcohol and does not use drugs.  No Known Allergies  Medications   Current Facility-Administered Medications:    sodium chloride  flush (NS) 0.9 % injection 3 mL, 3 mL, Intravenous, Once, Carin Charleston, MD  Current Outpatient Medications:    acetaminophen (TYLENOL) 325 MG tablet, Take 650 mg by mouth every 6  (six) hours as needed for mild pain., Disp: , Rfl:    ibuprofen (ADVIL) 200 MG tablet, Take 600-800 mg by mouth every 8 (eight) hours as needed for moderate pain., Disp: , Rfl:   Vitals   Vitals:   05/05/24 1033 05/05/24 1039 05/05/24 1044 05/05/24 1049  BP: 124/80     Pulse: (!) 58     Resp: 18     Temp:    (!) 97 F (36.1 C)  TempSrc:    Oral  SpO2: 100% 98%    Weight:   63 kg   Height:   5\' 7"  (1.702 m)     Body mass index is 21.75 kg/m.   Physical Exam   Physical Exam  HEENT:  [place]/AT Lungs: Respirations unlabored Extremities: Warm and well perfused.   Neurological Examination Mental Status: Awake and alert. Oriented x 5. Speech fluent with intact naming and comprehension.  Cranial Nerves: II: Visual fields intact bilaterally. No extinction to DSS.   III,IV, VI: No ptosis. EOMI. No nystagmus.  V: Temp sensation equal bilaterally VII: Smile symmetric VIII: Hearing intact to conversation IX,X: No hoarseness or hypophonia XI: Symmetric XII: Midline tongue extension Motor: Right : Upper extremity   5/5    Left:     Upper extremity   5/5  Lower extremity   5/5     Lower extremity   5/5 No pronator drift Sensory: Temp sensation decreased to LUE and RLE. FT intact x 4. No extinction to  DSS.  Deep Tendon Reflexes: 2+ and symmetric throughout Cerebellar: No ataxia with FNF or H-S bilaterally Gait: Deferred   Labs/Imaging/Neurodiagnostic studies   CBC:  Recent Labs  Lab 06/04/2024 1014 2024/06/04 1016  WBC  --  7.0  NEUTROABS  --  3.6  HGB 15.6 14.9  HCT 46.0 42.9  MCV  --  88.8  PLT  --  176   Basic Metabolic Panel:  Lab Results  Component Value Date   NA 138 06/04/24   K 3.3 (L) 06-04-24   CO2 26 04-Jun-2024   GLUCOSE 93 2024/06/04   BUN 10 04-Jun-2024   CREATININE 0.92 2024-06-04   CALCIUM 9.3 2024/06/04   GFRNONAA >60 Jun 04, 2024   Lipid Panel: No results found for: "LDLCALC" HgbA1c: No results found for: "HGBA1C" Urine Drug Screen: No results  found for: "LABOPIA", "COCAINSCRNUR", "LABBENZ", "AMPHETMU", "THCU", "LABBARB"  Alcohol Level     Component Value Date/Time   ETH <15 06/04/2024 1004   INR  Lab Results  Component Value Date   INR 1.1 06-04-2024   APTT  Lab Results  Component Value Date   APTT 30 06-04-24   Prior MRI scans from 01/28/20: 1. Unremarkable appearance of the brain. 2. Negative head MRA. 3. Negative neck MRA.  ASSESSMENT  Jeremy Little is a 38 y.o. male with a PMHx of migraine headaches, who presents from work to the ED via EMS after acute onset of a 4/10 non-throbbing occipital headache together with symptoms of dizziness, transient bilateral deafness, clamminess and left sided paresthesias with weakness. The patient states that the above symptoms were preceded by sharp pain over his left lateral chest wall and flank. LKN [id number]. Time of symptom onset was 0900. EMS noted left hand and leg weakness on scene. BP was 124/78 with CBG of 120. Upon arrival to the ED, the patient stated that his headache had improved to 2/10, but that he continued to have a right sided pins and needles sensation. He endorses seeing floaters on his left side at the onset of symptoms. He has had migraine headaches in the past that were associated with transient hearing loss, but this is the first time he has had any tingling.  - Exam with patchy mild sensory deficits. No focal weakness noted.  - CT head: Stable and normal noncontrast CT appearance of the brain. ASPECTS 10. - EKG: Sinus rhythm; ST elev, probable normal early repol pattern - Labs are normal except for a low potassium of 3.3. - Impression: Overall presentation is most consistent with complicated migraine.   RECOMMENDATIONS  - Observe for continued improvement in the ED - PRN medications for headache pain/discomfort - Outpatient Neurology or PCP follow up for migraine management.  - Call Neurology for further recommendations if he does not return to baseline.   ______________________________________________________________________    Hope Ly, Jaselle Pryer, MD [facility]

## 2024-05-05 NOTE — ED Provider Notes (Signed)
  Physical Exam  BP 94/63   Pulse (!) 51   Temp (!) 97 F (36.1 C) (Oral)   Resp 15   Ht 5\' 7"  (1.702 m)   Wt 63 kg   SpO2 99%   BMI 21.75 kg/m   Physical Exam  Procedures  Procedures  ED Course / MDM    Medical Decision Making Amount and/or Complexity of Data Reviewed Labs: ordered. Radiology: ordered.  Risk Prescription drug management.   Received in signout.  Headache with neurodeficits.  Have head symptoms before thought to be due to complicated migraine.  Headache improving as did symptoms.  Discussed with neurology.  No MRI at this time but follow with outpatient neurology.  Will discharge home.       Jeremy Arias, MD 05/05/24 1538

## 2024-05-05 NOTE — ED Provider Notes (Addendum)
 [place] EMERGENCY DEPARTMENT AT [facility] Provider Note   CSN: [id number] Arrival date & time: 05/05/24  1002  An emergency department physician performed an initial assessment on this suspected stroke patient at 58.  History  Chief Complaint  Patient presents with   Code Stroke    Jeremy Little is a 38 y.o. male.  38 year old male with past medical history of migraine headaches presenting to the emergency department today with left-sided tingling as well as headache.  The patient states that he also was having some pain over his left lateral chest wall that started when he was at work.  He states this began when he became sweaty.  He states that he is no longer having any pain there.  He states that he developed a headache shortly after and later developed difficulty hearing.  He was having some tingling in the left arm and left leg and was brought to the emergency department at that time for further evaluation.  The symptoms have been going on now for around 1 hour.  He states that all of his symptoms have improved.  He reports a mild headache currently.  Reports that he is still having some mild tingling in his hands.  States that he felt some weakness earlier but this has improved significantly as well.  He does report a history of headaches and has had the hearing issues with headaches in the past but this is the first time he has had any tingling.  Denies any fevers or chills.  He was initially a code stroke.        Home Medications Prior to Admission medications   Medication Sig Start Date End Date Taking? Authorizing Provider  acetaminophen (TYLENOL) 325 MG tablet Take 650 mg by mouth every 6 (six) hours as needed for mild pain.   Yes [provider]  ibuprofen (ADVIL) 200 MG tablet Take 600-800 mg by mouth every 8 (eight) hours as needed for moderate pain.   Yes [provider]      Allergies    Patient has no known allergies.    Review  of Systems   Review of Systems  Neurological:  Positive for headaches.  All other systems reviewed and are negative.   Physical Exam Updated Vital Signs BP 94/63   Pulse (!) 51   Temp (!) 97 F (36.1 C) (Oral)   Resp 15   Ht 5\' 7"  (1.702 m)   Wt 63 kg   SpO2 99%   BMI 21.75 kg/m  Physical Exam Vitals and nursing note reviewed.   Gen: NAD Eyes: PERRL, EOMI HEENT: no oropharyngeal swelling Neck: trachea midline Resp: clear to auscultation bilaterally Card: RRR, no murmurs, rubs, or gallops Abd: nontender, nondistended Extremities: no calf tenderness, no edema Vascular: 2+ radial pulses bilaterally, 2+ DP pulses bilaterally Neuro:, Cranial nerves intact, equal strength and sensation throughout bilateral upper and lower extremities with exception of some mild numbness on the left upper and lower extremity, no dysmetria on finger-to-nose testing Skin: no rashes Psyc: acting appropriately   ED Results / Procedures / Treatments   Labs (all labs ordered are listed, but only abnormal results are displayed) Labs Reviewed  COMPREHENSIVE METABOLIC PANEL WITH GFR - Abnormal; Notable for the following components:      Result Value   Potassium 3.3 (*)    All other components within normal limits  I-STAT CHEM 8, ED - Abnormal; Notable for the following components:   Potassium 3.3 (*)  All other components within normal limits  PROTIME-INR  APTT  CBC  DIFFERENTIAL  ETHANOL  CBG MONITORING, ED  TROPONIN I (HIGH SENSITIVITY)  TROPONIN I (HIGH SENSITIVITY)    EKG None  Radiology DG Chest Portable 1 View Result Date: 05/05/2024 CLINICAL DATA:  Chest pain. EXAM: PORTABLE CHEST 1 VIEW COMPARISON:  Chest radiograph dated 03/05/2017. FINDINGS: The heart size and mediastinal contours are within normal limits. Both lungs are clear. The visualized skeletal structures are unremarkable. IMPRESSION: No active disease. Electronically Signed   By: Angus Bark M.D.   On: 05/05/2024  11:20   CT HEAD CODE STROKE WO CONTRAST Result Date: 05/05/2024 CLINICAL DATA:  Code stroke. 38 year old male neurologic deficit, left-side. EXAM: CT HEAD WITHOUT CONTRAST TECHNIQUE: Contiguous axial images were obtained from the base of the skull through the vertex without intravenous contrast. RADIATION DOSE REDUCTION: This exam was performed according to the departmental dose-optimization program which includes automated exposure control, adjustment of the mA and/or kV according to patient size and/or use of iterative reconstruction technique. COMPARISON:  Brain MRI and Head CT03/02/2020. FINDINGS: Brain: Cerebral volume remains normal. No midline shift, ventriculomegaly, mass effect, evidence of mass lesion, intracranial hemorrhage or evidence of cortically based acute infarction. Gray-white matter differentiation is within normal limits throughout the brain. Vascular: No suspicious intracranial vascular hyperdensity. Skull: Stable and intact. Sinuses/Orbits: Visualized paranasal sinuses and mastoids are stable and well aerated. Other: Visualized orbits and scalp soft tissues are within normal limits. No gaze deviation. ASPECTS [facility] Stroke Program Early CT Score) Total score (0-10 with 10 being normal): 10 IMPRESSION: Stable and normal noncontrast CT appearance of the brain. ASPECTS 10. These results were communicated to Dr. Lindzen at 10:23 am on 05/05/2024 by text page via the [facility] messaging system. Electronically Signed   By: Marlise Simpers M.D.   On: 05/05/2024 10:23    Procedures Procedures    Medications Ordered in ED Medications  sodium chloride  flush (NS) 0.9 % injection 3 mL (3 mLs Intravenous Not Given 05/05/24 1039)  lactated ringers bolus 1,000 mL (0 mLs Intravenous Stopped 05/05/24 1344)  prochlorperazine  (COMPAZINE ) injection 10 mg (10 mg Intravenous Given 05/05/24 1057)  diphenhydrAMINE  (BENADRYL ) injection 25 mg (25 mg Intravenous Given 05/05/24 1058)    ED Course/ Medical Decision  Making/ A&P                                 Medical Decision Making 38 year old male with past medical history of migraine headaches presenting to the emergency department today with left-sided tingling and headache.  I will further evaluate the patient here with a CT scan.  He was evaluated by our stroke neurology team on arrival.  Will give the patient Compazine  and Benadryl  in the event that this is due to complex migraine.  Given the history of similar headaches strongly suspect this.  Will obtain an EKG, chest x-ray, and troponin in regards to his chest discomfort which has already resolved.  Suspicion for aortic dissection is low at this time given reassuring neurovascular exam.  His symptoms have also resolved in regards to the chest pain and he is not hypertensive here which goes against this as well.  I will reevaluate for ultimate disposition.  The patient's labs are reassuring and his symptoms resolved with the migraine cocktail.  His disposition is pending at the time of signout awaiting neurology recommendations.  CRITICAL CARE Performed by: Carin Charleston  Total critical care time: 35 minutes  Critical care time was exclusive of separately billable procedures and treating other patients.  Critical care was necessary to treat or prevent imminent or life-threatening deterioration.  Critical care was time spent personally by me on the following activities: development of treatment plan with patient and/or surrogate as well as nursing, discussions with consultants, evaluation of patient's response to treatment, examination of patient, obtaining history from patient or surrogate, ordering and performing treatments and interventions, ordering and review of laboratory studies, ordering and review of radiographic studies, pulse oximetry and re-evaluation of patient's condition.   Amount and/or Complexity of Data Reviewed Labs: ordered. Radiology: ordered.  Risk Prescription drug  management.           Final Clinical Impression(s) / ED Diagnoses Final diagnoses:  Nonintractable headache, unspecified chronicity pattern, unspecified headache type  Paresthesia    Rx / DC Orders ED Discharge Orders          Ordered    Ambulatory referral to Neurology       Comments: An appointment is requested in approximately: 2 weeks   05/05/24 1415              Carin Charleston, MD 05/05/24 1448    Carin Charleston, MD 05/05/24 [phone]

## 2024-05-05 NOTE — Discharge Instructions (Addendum)
 Your workup today was reassuring.  Please follow-up with the neurologist.  I have placed a consult so you may receive a call in the next few days regarding follow-up.  Return to the emergency department for worsening symptoms.

## 2024-05-05 NOTE — ED Triage Notes (Signed)
 PT lsw 0859 at work. At 0900 pt became dizzy, clammy, had HA 4/10 happened all of a sudden. Pt had left sided pins and needles and weakness and was unable to hear out of both ears. Upon arrival pt deficits are HA 2/10 and right sided pins and needles.

## 2024-05-05 NOTE — Code Documentation (Signed)
 Stroke Response Nurse Documentation Code Documentation  Jeremy Little is a 38 y.o. male arriving to [facility]  via [facility] on 05/05/2024 with past medical hx of migraine. On No antithrombotic. Code stroke was activated by EMS.   Patient from work where he was LKW at [phone] and now complaining of h/a, subjective left sided weakness and sensory loss. Pt was at work and suddenly had a severe H/A, temporary loss of hearing, and left sensory and motor loss.  Stroke team at the bedside on patient arrival. Labs drawn and patient cleared for CT by EDP Patient to CT with team. NIHSS 1, see documentation for details and code stroke times. Patient with left decreased sensation on exam. The following imaging was completed:  CT Head. Patient is not a candidate for IV Thrombolytic due to stroke not suspected, non- disabling symptoms. Patient is not a candidate for IR due to no LVO symptoms on exam..   Care Plan: q 30 min NIHSS and VS until OOW (13:40), then q 2 for 12 hrs, then q 4. NPO until stroke swallow screen completed. BP < 220/110.  Bedside handoff with ED RN complete.  Jaquelin Meaney Livengood  Stroke Response RN

## 2024-06-30 ENCOUNTER — Ambulatory Visit (INDEPENDENT_AMBULATORY_CARE_PROVIDER_SITE_OTHER): Admitting: Neurology

## 2024-06-30 ENCOUNTER — Encounter: Payer: Self-pay | Admitting: Neurology

## 2024-06-30 VITALS — BP 123/80 | HR 73 | Ht 65.5 in | Wt 134.2 lb

## 2024-06-30 DIAGNOSIS — R519 Headache, unspecified: Secondary | ICD-10-CM | POA: Diagnosis not present

## 2024-06-30 DIAGNOSIS — H9319 Tinnitus, unspecified ear: Secondary | ICD-10-CM | POA: Diagnosis not present

## 2024-06-30 NOTE — Progress Notes (Signed)
 Subjective:    Patient ID: Jeremy Little is a 38 y.o. male.  HPI    True Mar, MD, PhD [facility] [address], [address] [address] [place], [place] [address]  I saw patient, Jeremy Little, as a referral from the emergency room for evaluation of his headaches.  The patient is accompanied by his father today.  Jeremy Little is a 38 year old male with an underlying benign medical history, who reports recurrent headaches.  He had 2 recent migraine-like headaches since his emergency room visit in June.  He does not have frequent migraines but does take quite a bit of Advil or Aleve when he gets 1.  He takes up to 16 pills throughout the day, usually 4 pills at a time up to 4 times a day.  He he is up-to-date with his eye examination.  He has prescription eyeglasses.  He tries to hydrate well but drinks quite a bit of water, up to 8 bottles per day.  He quit smoking about a year ago but vapes daily.  He is not currently actively working on vaping cessation.  He has a longstanding history of ringing in his ears.  He has not seen ENT recently.  He does not have a current PCP.  He is currently without a headache and does not have any numbness or tingling.  He drinks caffeine in the form of decaf coffee, 1 cup in the morning and 1 energy drink per day.  He does not drink any alcohol.    He presented to the emergency room at [facility] on 05/05/2024 with a chief complaint of headache as well as left-sided tingling.  He also had some chest pain while at work.  He reported difficulty hearing.  Laboratory testing showed negative troponin x 2.  CMP showed below normal potassium at 3.3, otherwise unremarkable.  Ethanol level was less than 15, CBC unremarkable, PT/INR normal. He was treated symptomatically with fluids, diphenhydramine , prochlorperazine .  I reviewed the emergency room records.  He had a head CT without contrast on 05/05/2024 and I reviewed the  results:   IMPRESSION: Stable and normal noncontrast CT appearance of the brain. ASPECTS 10. In addition, I personally and independently reviewed images through the PACS system.  I had evaluated him for recurrent headaches in the past.  At the time, he was advised to proceed with lifestyle modification primarily, increasing water intake, decreasing caffeine intake and pursuing also a formal eye examination.  He did not return for a follow-up appointment.    Previously:  02/02/2020: 38 year old right-handed gentleman with an underlying medical history of migraine headaches as a teenager and young adult, who reports a history of migraines when he was in high school. Last week he had a severe headache for the first time in a long time. He does endorse stress, his mother is concerned that he does not eat on a regular basis and may not hydrate well enough. He admits that he has done a little better with his water intake, averages about 2-3 bottles of water per day. He does drink quite a bit of caffeine and has also taken Excedrin for the past 2 days, 2 pills twice daily. He did not take any Excedrin today. He believes that the caffeine helps his headaches. He drinks 1 to 2 cups of coffee in the morning and 2-3 large cups of soda, 16 ounce size every day. He has had prescription eyeglasses since age 56. He has not been wearing  his eyeglasses according to the mother. He has not had an eye examination in about 2 years he believes. He overall feels better since he presented to the emergency room. His hearing is better, headache is also improved, he denies any sudden onset of one-sided weakness or numbness or tingling or droopy face or slurring of speech. He tries to achieve about 7 to 8 h of sleep, denies any snoring or choking sensations at night. He lives with his parents, he does have a TV on in his bedroom at night. He works in Holiday [employer]. He denies any significant nausea or vomiting, he had felt dizzy when  the headache was severe last week but denies any lightheadedness currently. He smokes 1 pack/day and is not currently considering smoking cessation. He drinks alcohol in moderation, 2-3 beers about 3 days out of the week.   He presented to the emergency room on 01/28/2020 with sudden onset of severe headache in the back of his head and bilateral temple areas associated with sudden onset of hearing loss.  I reviewed the emergency room records, he also had a neurological consult in the ER which I reviewed.  He had several imaging tests in the ER.  He had a head CT without contrast on 01/28/2020 and I reviewed the results: IMPRESSION: Normal CT of the head.   He had a brain MRI without contrast as well as MRA head without contrast and MRA neck without contrast on 01/28/2020 and I reviewed the results: IMPRESSION: 1. Unremarkable appearance of the brain. 2. Negative head MRA. 3. Negative neck MRA.   He saw ENT as an outpatient, Dr. Ethyl on 01/29/2020 and I reviewed the note: He was found to have no hearing loss on audiology testing and exam was benign.     His Past Medical History Is Significant For: History reviewed. No pertinent past medical history.  His Past Surgical History Is Significant For: History reviewed. No pertinent surgical history.  His Family History Is Significant For: Family History  Problem Relation Age of Onset   High Cholesterol Father    Hypertension Father    Diabetes Maternal Grandmother     His Social History Is Significant For: Social History   Socioeconomic History   Marital status: Married    Spouse name: vania   Number of children: 3   Years of education: Not on file   Highest education level: Not on file  Occupational History   Not on file  Tobacco Use   Smoking status: Former    Current packs/day: 1.00    Average packs/day: 1 pack/day for 21.6 years (21.6 ttl pk-yrs)    Types: Cigarettes    Start date: 2004   Smokeless tobacco: Never  Vaping Use    Vaping status: Every Day  Substance and Sexual Activity   Alcohol use: No   Drug use: No    Comment: not since high school   Sexual activity: Not on file  Other Topics Concern   Not on file  Social History Narrative   Caffiene 1 [employer]bull in am   Lives wife kids   Working:  Producer, television/film/video Firefighter)   Social Drivers of Corporate investment banker Strain: Not on file  Food Insecurity: Not on file  Transportation Needs: Not on file  Physical Activity: Not on file  Stress: Not on file  Social Connections: Unknown (04/10/2022)   Received from [facility]   Social Network    Social Network: Not on file  His Allergies Are:  No Known Allergies:   His Current Medications Are:  Outpatient Encounter Medications as of 06/30/2024  Medication Sig   acetaminophen (TYLENOL) 325 MG tablet Take 650 mg by mouth every 6 (six) hours as needed for mild pain.   ibuprofen (ADVIL) 200 MG tablet Take 600-800 mg by mouth every 8 (eight) hours as needed for moderate pain.   No facility-administered encounter medications on file as of 06/30/2024.  :   Review of Systems:  Out of a complete 14 point review of systems, all are reviewed and negative with the exception of these symptoms as listed below:    Review of Systems  Neurological:        Pt here for hemiplegic migraines.  Has had 3 episodes 1) had hearing loss L, 2) hearing and vision loss and last 3) ED stroke like sx ( Lnumbness/tingling, weakness lasted for 2 hrs) received migraine cocktail -better).  No family hx migraines.       Objective:  Neurological Exam  Physical Exam  Physical Examination:   Vitals:   06/30/24 1442  BP: 123/80  Pulse: 73   General Examination: The patient is in NAD.   HEENT: Normocephalic, atraumatic, pupils are equal, round and reactive to light and accommodation.  Funduscopic exam benign.  Corrective eyeglasses in place.  Hearing grossly intact.  Face is symmetric with normal facial animation and  normal facial sensation to light touch, temperature and vibration sense.  No lip, neck/head, jaw or voice tremor. Neck is supple with full range of motion.  No carotid bruits.  Airway examination reveals mild to moderate mouth dryness.  Tongue protrudes centrally and palate elevates symmetrically.  Speech is scant, no dysarthria.   Chest: Clear to auscultation without wheezing, rhonchi or crackles noted.   Heart: S1+S2+0, regular and normal without murmurs, rubs or gallops noted.    Abdomen: Soft, non-tender and non-distended with normal bowel sounds appreciated on auscultation.   Extremities: There is no pitting edema in the distal lower extremities bilaterally.    Skin: Warm and dry without trophic changes noted.   Musculoskeletal: exam reveals no obvious joint deformities.    Neurologically:  Mental status: The patient is awake, alert and oriented in all 4 spheres. His immediate and remote memory, attention, language skills and fund of knowledge are appropriate. There is no evidence of aphasia, agnosia, apraxia or anomia. Speech is clear with normal prosody and enunciation. Thought process is linear. Mood is constricted.  Cranial nerves II - XII are as described above under HEENT exam. Motor exam: Normal bulk, strength and tone is noted. There is no drift, tremor or rebound.  Romberg is negative. Reflexes are 1+ throughout. Babinski: Toes are flexor bilaterally.  Fine motor skills and coordination: intact with normal finger taps, normal hand movements, normal rapid alternating patting, normal foot taps and normal foot agility.  Cerebellar testing: No dysmetria or intention tremor on finger to nose testing. Heel to shin is unremarkable bilaterally. There is no truncal or gait ataxia.  Sensory exam: intact to light touch, vibration, and temperature sense in the upper and lower extremities.  Gait, station and balance: He stands easily. No veering to one side is noted. No leaning to one side is  noted. Posture is age-appropriate and stance is narrow based. Gait shows normal stride length and normal pace. No problems turning are noted. Tandem walk is unremarkable.   Assessment and plan:   In summary, Jeremy Little is a 38 year old male with  a benign medical history who presents for evaluation of his headaches.  He has had intermittent headaches, he has chronic tinnitus.  We talked about headache triggers quite a bit and alleviating and exacerbating factors.  Neurological exam is nonfocal, recent head CT benign, I offered to order a brain MRI but he declined, he reported that he recently had a brain MRI, however, through the ER he had a CT scan.  I had a long chat with the patient and and his father.  He was advised to establish with a PCP.  He was advised to avoid taking high-dose ibuprofen or any other over-the-counter anti-inflammatory medication for that matter.  We talked about headache fluctuation in the context of blood pressure fluctuation, we also talked about overhydration that can lead to electrolyte disturbance.  Of note, during the ER visit in June he was found to have low potassium.   He was given detailed verbal instructions during this visit. He was supposed to get a printed copy of the after visit summary but did not wait for the print out at check out. We will therefore mail his instructions.   << Please remember, common headache triggers are: sleep deprivation, dehydration, overheating, stress, hypoglycemia or skipping meals and blood sugar fluctuations, excessive pain medications or excessive alcohol use or caffeine withdrawal. Some people have food triggers such as aged cheese, orange juice or chocolate, especially dark chocolate, or MSG (monosodium glutamate). Try to avoid these headache triggers as much possible. It may be helpful to keep a headache diary to figure out what makes your headaches worse or brings them on and what alleviates them. Some people report headache  onset after exercise but studies have shown that regular exercise may actually prevent headaches from coming. If you have exercise-induced headaches, please make sure that you drink plenty of fluid before and after exercising and that you do not over do it and do not overheat. Please avoid drinking energy drinks altogether.  Establish with a primary care for a general check up and monitoring for high blood pressure, as blood pressure fluctuation can cause headaches. Your PCP can also assist with nicotine cessation. I recommend you follow up with an ENT (ear, nose and throat) specialist for ringing in the ears.  We can consider a brain scan, called MRI and call you with the test results. We will have to schedule you for this on a separate date. This test requires authorization from your insurance, and we will take care of the insurance process. Let me know, if you would like to proceed with one. You had a CT scan in the ER, as I mentioned. We can consider a sleep study to look for signs of obstructive sleep apnea (aka OSA), as untreated sleep apnea can cause headaches.  I recommend a follow with one of our nurse practitioners in 6 months.   >>

## 2024-06-30 NOTE — Patient Instructions (Addendum)
  As discussed, your headaches are likely due to a combination of factors.   Here is what we discussed today and my recommendations for you:   Please remember, common headache triggers are: sleep deprivation, dehydration, overheating, stress, hypoglycemia or skipping meals and blood sugar fluctuations, excessive pain medications or excessive alcohol use or caffeine withdrawal. Some people have food triggers such as aged cheese, orange juice or chocolate, especially dark chocolate, or MSG (monosodium glutamate). Try to avoid these headache triggers as much possible. It may be helpful to keep a headache diary to figure out what makes your headaches worse or brings them on and what alleviates them. Some people report headache onset after exercise but studies have shown that regular exercise may actually prevent headaches from coming. If you have exercise-induced headaches, please make sure that you drink plenty of fluid before and after exercising and that you do not over do it and do not overheat. Please avoid drinking energy drinks altogether.  Establish with a primary care for a general check up and monitoring for high blood pressure, as blood pressure fluctuation can cause headaches. Your PCP can also assist with nicotine cessation. I recommend you follow up with an ENT (ear, nose and throat) specialist for ringing in the ears.  We can consider a brain scan, called MRI and call you with the test results. We will have to schedule you for this on a separate date. This test requires authorization from your insurance, and we will take care of the insurance process. Let me know, if you would like to proceed with one. You had a CT scan in the ER, as I mentioned. We can consider a sleep study to look for signs of obstructive sleep apnea (aka OSA), as untreated sleep apnea can cause headaches.  I recommend a follow with one of our nurse practitioners in 6 months.
